# Patient Record
Sex: Female | Born: 1950 | Race: Black or African American | Hispanic: No | Marital: Single | Smoking: Never smoker
Health system: Southern US, Community
[De-identification: ages and names within clinical notes are randomized; demographics above are authoritative.]

## PROBLEM LIST (undated history)

## (undated) DIAGNOSIS — I10 Essential (primary) hypertension: Secondary | ICD-10-CM

## (undated) DIAGNOSIS — E7800 Pure hypercholesterolemia, unspecified: Secondary | ICD-10-CM

## (undated) HISTORY — PX: MYOMECTOMY: SHX85

---

## 1997-12-29 ENCOUNTER — Other Ambulatory Visit: Admission: RE | Admit: 1997-12-29 | Discharge: 1997-12-29 | Payer: Self-pay | Admitting: Family Medicine

## 2013-12-23 ENCOUNTER — Emergency Department (HOSPITAL_BASED_OUTPATIENT_CLINIC_OR_DEPARTMENT_OTHER)

## 2013-12-23 ENCOUNTER — Emergency Department (HOSPITAL_BASED_OUTPATIENT_CLINIC_OR_DEPARTMENT_OTHER)
Admission: EM | Admit: 2013-12-23 | Discharge: 2013-12-23 | Disposition: A | Discharge status: Home / Self Care | Attending: Emergency Medicine | Admitting: Emergency Medicine

## 2013-12-23 ENCOUNTER — Encounter (HOSPITAL_BASED_OUTPATIENT_CLINIC_OR_DEPARTMENT_OTHER): Payer: Self-pay | Admitting: Emergency Medicine

## 2013-12-23 DIAGNOSIS — Y990 Civilian activity done for income or pay: Secondary | ICD-10-CM | POA: Insufficient documentation

## 2013-12-23 DIAGNOSIS — IMO0002 Reserved for concepts with insufficient information to code with codable children: Secondary | ICD-10-CM | POA: Diagnosis not present

## 2013-12-23 DIAGNOSIS — Y9389 Activity, other specified: Secondary | ICD-10-CM | POA: Insufficient documentation

## 2013-12-23 DIAGNOSIS — I10 Essential (primary) hypertension: Secondary | ICD-10-CM | POA: Insufficient documentation

## 2013-12-23 DIAGNOSIS — Y9289 Other specified places as the place of occurrence of the external cause: Secondary | ICD-10-CM | POA: Diagnosis not present

## 2013-12-23 DIAGNOSIS — W2209XA Striking against other stationary object, initial encounter: Secondary | ICD-10-CM | POA: Insufficient documentation

## 2013-12-23 DIAGNOSIS — Z79899 Other long term (current) drug therapy: Secondary | ICD-10-CM | POA: Diagnosis not present

## 2013-12-23 DIAGNOSIS — S0990XA Unspecified injury of head, initial encounter: Secondary | ICD-10-CM | POA: Diagnosis not present

## 2013-12-23 DIAGNOSIS — T07XXXA Unspecified multiple injuries, initial encounter: Secondary | ICD-10-CM

## 2013-12-23 DIAGNOSIS — S0993XA Unspecified injury of face, initial encounter: Secondary | ICD-10-CM | POA: Diagnosis not present

## 2013-12-23 DIAGNOSIS — E7800 Pure hypercholesterolemia, unspecified: Secondary | ICD-10-CM | POA: Diagnosis not present

## 2013-12-23 DIAGNOSIS — Z880 Allergy status to penicillin: Secondary | ICD-10-CM | POA: Diagnosis not present

## 2013-12-23 DIAGNOSIS — S199XXA Unspecified injury of neck, initial encounter: Secondary | ICD-10-CM

## 2013-12-23 HISTORY — DX: Essential (primary) hypertension: I10

## 2013-12-23 HISTORY — DX: Pure hypercholesterolemia, unspecified: E78.00

## 2013-12-23 MED ORDER — NAPROXEN 375 MG PO TABS: 375.0000 mg | ORAL_TABLET | Freq: Two times a day (BID) | ORAL | Status: AC

## 2013-12-23 MED ORDER — IBUPROFEN 800 MG PO TABS
800.0000 mg | ORAL_TABLET | Freq: Once | ORAL | Status: AC
  Administered 2013-12-23: 800 mg via ORAL
  Filled 2013-12-23: qty 1

## 2013-12-23 NOTE — ED Notes (Signed)
Patient reports that she was at work and a post hit her to the left side of her face leaving an abraision, and to the right neck. Patient states that she may have been hit to the left knee since she has pain there as well.

## 2013-12-23 NOTE — Discharge Instructions (Signed)

## 2013-12-23 NOTE — ED Provider Notes (Signed)
CSN: [id number]     Arrival date & time 12/23/13  0204 History   First MD Initiated Contact with Patient 12/23/13 0315     Chief Complaint  Patient presents with  . Head Injury  . Knee Pain     (Consider location/radiation/quality/duration/timing/severity/associated sxs/prior Treatment) Patient is a 63 y.o. female presenting with head injury. The history is provided by the patient.  Head Injury Location:  L temporal Mechanism of injury: direct blow   Mechanism of injury comment:  Metal gate from a mail container that hit the right neck causing an abrasion and then hit patient again in left temple Pain details:    Radiates to:  Face   Severity:  Moderate   Timing:  Constant   Progression:  Unchanged Chronicity:  New Relieved by:  Nothing Worsened by:  Nothing tried Ineffective treatments:  None tried Associated symptoms: no blurred vision, no neck pain, no numbness, no seizures and no vomiting   Risk factors: occupational exposure   Tetanus UTD.  Reports left knee hurts as well.    Past Medical History  Diagnosis Date  . Hypertension   . Hypercholesteremia    Past Surgical History  Procedure Laterality Date  . Myomectomy     No family history on file. History  Substance Use Topics  . Smoking status: Never Smoker   . Smokeless tobacco: Not on file  . Alcohol Use: No   OB History   Grav Para Term Preterm Abortions TAB SAB Ect Mult Living                 Review of Systems  Constitutional: Negative for fever.  Eyes: Negative for blurred vision.  Gastrointestinal: Negative for vomiting.  Musculoskeletal: Negative for neck pain.  Neurological: Negative for seizures, facial asymmetry, weakness and numbness.  All other systems reviewed and are negative.     Allergies  Penicillins  Home Medications   Prior to Admission medications   Medication Sig Start Date End Date Taking? Authorizing Provider  pravastatin (PRAVACHOL) 10 MG tablet Take 10 mg by mouth daily.    Yes Historical Provider, MD  triamterene (DYRENIUM) 50 MG capsule Take 50 mg by mouth daily.   Yes Historical Provider, MD   BP 134/76  Pulse 73  Temp(Src) 98.1 F (36.7 C) (Oral)  Resp 18  Wt 155 lb (70.308 kg)  SpO2 100% Physical Exam  Constitutional: She is oriented to person, place, and time. She appears well-developed and well-nourished. No distress.  HENT:  Head: Normocephalic. Head is without raccoon's eyes and without Battle's sign.    Right Ear: No hemotympanum.  Left Ear: No hemotympanum.  Mouth/Throat: Oropharynx is clear and moist.  Eyes: Conjunctivae and EOM are normal. Pupils are equal, round, and reactive to light.  Neck: Normal range of motion. Neck supple.  No bony tenderness  Cardiovascular: Normal rate, regular rhythm and intact distal pulses.   Pulmonary/Chest: Effort normal and breath sounds normal. She has no wheezes. She has no rales.  Abdominal: Soft. Bowel sounds are normal. There is no tenderness. There is no rebound and no guarding.  Musculoskeletal: Normal range of motion.  Left knee has a bruise that is old in nature as is yellowing over tibial plateau.  There is no plateau tenderness. No effusion no swelling no new bruising or hematomas.  No patella alta or baja.  Negative anterior and posterior drawer tests;  no laxity to varus or valgus stress  Neurological: She is alert and oriented to person,  place, and time. She has normal reflexes.  Skin: Skin is warm and dry.  Psychiatric: She has a normal mood and affect.    ED Course  Procedures (including critical care time) Labs Review Labs Reviewed - No data to display  Imaging Review Ct Head Wo Contrast  12/23/2013   CLINICAL DATA:  Struck head on a pole at work. Head injury. Headache. Neck pain. No loss of consciousness.  EXAM: CT HEAD WITHOUT CONTRAST  CT CERVICAL SPINE WITHOUT CONTRAST  TECHNIQUE: Multidetector CT imaging of the head and cervical spine was performed following the standard protocol  without intravenous contrast. Multiplanar CT image reconstructions of the cervical spine were also generated.  COMPARISON:  None.  FINDINGS: CT HEAD FINDINGS  Ventricles and sulci appear symmetrical. No mass effect or midline shift. No abnormal extra-axial fluid collections. Gray-white matter junctions are distinct. Basal cisterns are not effaced. No evidence of acute intracranial hemorrhage. No depressed skull fractures. Air-fluid level in the left maxillary antrum. This could indicate sinusitis or may be posttraumatic.  CT CERVICAL SPINE FINDINGS  There is reversal of the usual cervical lordosis. This could be due to patient positioning but ligamentous injury or muscle spasm can also have this appearance. Degenerative changes throughout the cervical spine with narrowed cervical interspaces and endplate hypertrophic changes most prominent at C4-5, C5-6, and C6-7 levels. Prominent disc osteophyte complexes at C4-5 and C5-6 levels. Mild degenerative changes in the facet joints. Normal alignment of the facet joints. C1-2 articulation appears intact. No vertebral compression deformities. No prevertebral soft tissue swelling. No focal bone lesion or bone destruction. Bone cortex and trabecular architecture appear intact.  IMPRESSION: No acute intracranial abnormalities. Air-fluid level in the left maxillary antrum.  Nonspecific reversal of the usual cervical lordosis. No displaced cervical spine fractures identified   Electronically Signed   By: Burman NievesWilliam  Stevens M.D.   On: 12/23/2013 03:55   Ct Cervical Spine Wo Contrast  12/23/2013   CLINICAL DATA:  Struck head on a pole at work. Head injury. Headache. Neck pain. No loss of consciousness.  EXAM: CT HEAD WITHOUT CONTRAST  CT CERVICAL SPINE WITHOUT CONTRAST  TECHNIQUE: Multidetector CT imaging of the head and cervical spine was performed following the standard protocol without intravenous contrast. Multiplanar CT image reconstructions of the cervical spine were also  generated.  COMPARISON:  None.  FINDINGS: CT HEAD FINDINGS  Ventricles and sulci appear symmetrical. No mass effect or midline shift. No abnormal extra-axial fluid collections. Gray-white matter junctions are distinct. Basal cisterns are not effaced. No evidence of acute intracranial hemorrhage. No depressed skull fractures. Air-fluid level in the left maxillary antrum. This could indicate sinusitis or may be posttraumatic.  CT CERVICAL SPINE FINDINGS  There is reversal of the usual cervical lordosis. This could be due to patient positioning but ligamentous injury or muscle spasm can also have this appearance. Degenerative changes throughout the cervical spine with narrowed cervical interspaces and endplate hypertrophic changes most prominent at C4-5, C5-6, and C6-7 levels. Prominent disc osteophyte complexes at C4-5 and C5-6 levels. Mild degenerative changes in the facet joints. Normal alignment of the facet joints. C1-2 articulation appears intact. No vertebral compression deformities. No prevertebral soft tissue swelling. No focal bone lesion or bone destruction. Bone cortex and trabecular architecture appear intact.  IMPRESSION: No acute intracranial abnormalities. Air-fluid level in the left maxillary antrum.  Nonspecific reversal of the usual cervical lordosis. No displaced cervical spine fractures identified   Electronically Signed   By: Marisa CyphersWilliam  Stevens M.D.  On: 12/23/2013 03:55     EKG Interpretation None      MDM   Final diagnoses:  None   Tetanus UTD Abrasions, bacitracin applied and clean dressing.  Neosporin twice daily x 7 days.  Patient has no weakness or numbness in flexion or extension. Ligamentous injury very unlikely.  NO bony tenderness.  NSAIDs for pain.     Jasmine Awe, MD 12/23/13 [phone]

## 2015-08-25 IMAGING — CT CT CERVICAL SPINE W/O CM
3 of 4 series · 12 of 33 positions shown, 14 images · non-contrast
Comparison: None.

CLINICAL DATA: Struck head on a pole at work. Head injury.
Headache. Neck pain. No loss of consciousness.

EXAM:
CT HEAD WITHOUT CONTRAST
CT CERVICAL SPINE WITHOUT CONTRAST
TECHNIQUE: Multidetector CT imaging of the head and cervical spine was
performed following the standard protocol without intravenous
contrast. Multiplanar CT image reconstructions of the cervical spine
were also generated.

[Series 6: c_spine 2.0 coronal · coronal · 0.25mm/px · 3 of 44 slices shown]
[im 9/44  bone]
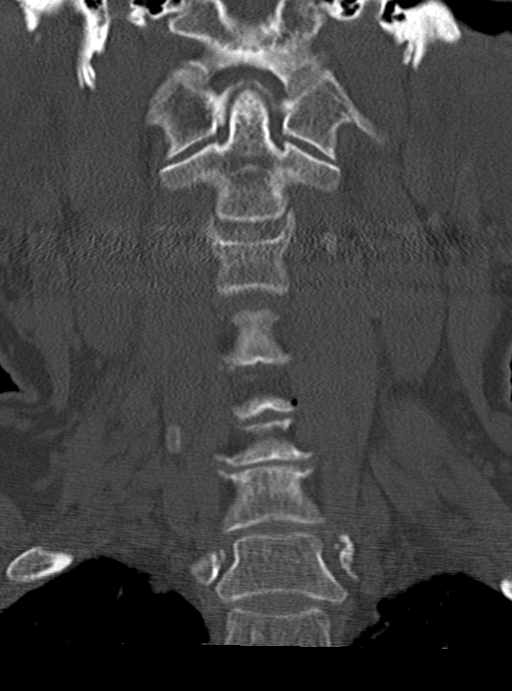
[im 18/44  bone]
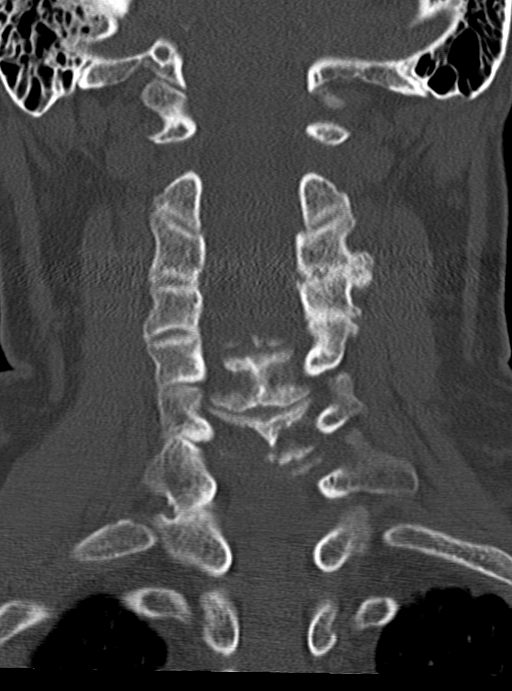
[im 26/44  bone]
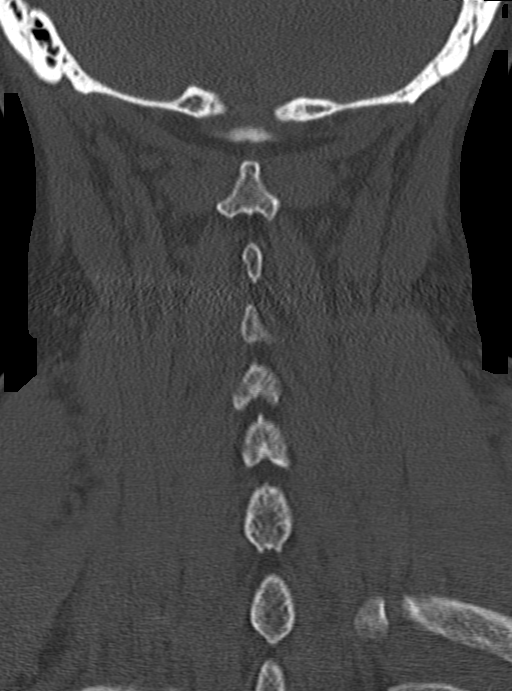

[Series 7: c_spine 2.0 sagittal · sagittal · 0.25mm/px · 5 of 52 slices shown, 6 images]
[im 18/52  bone]
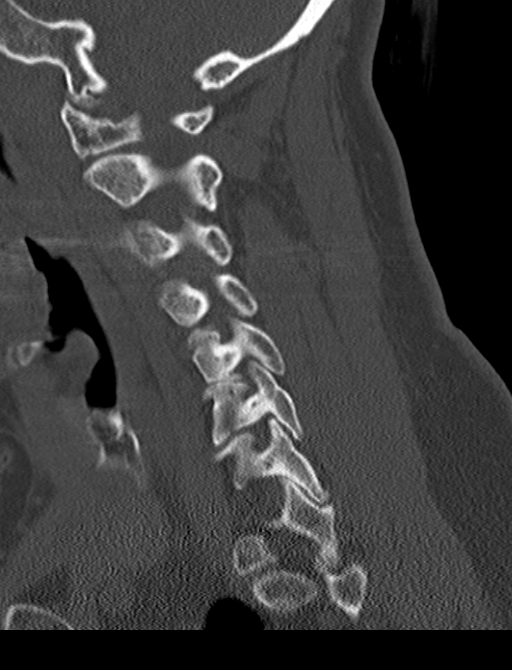
[im 22/52  bone]
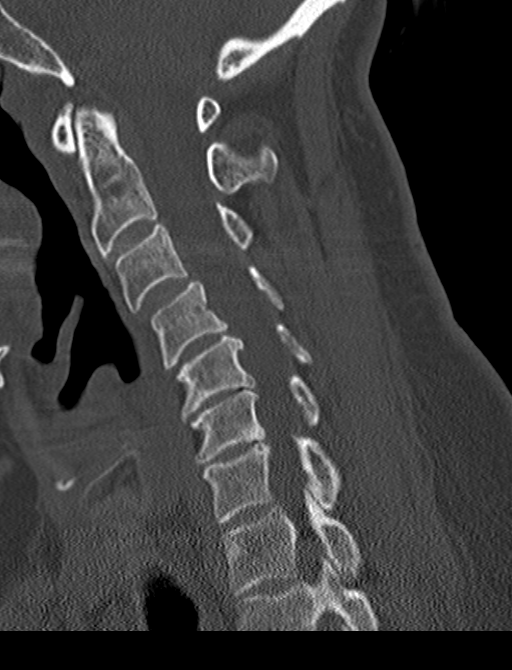
[im 26/52  soft-tissue]
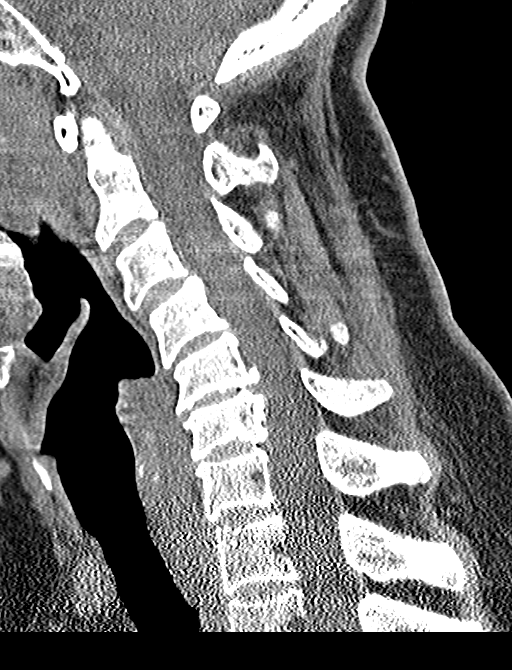
[im 26/52  bone]
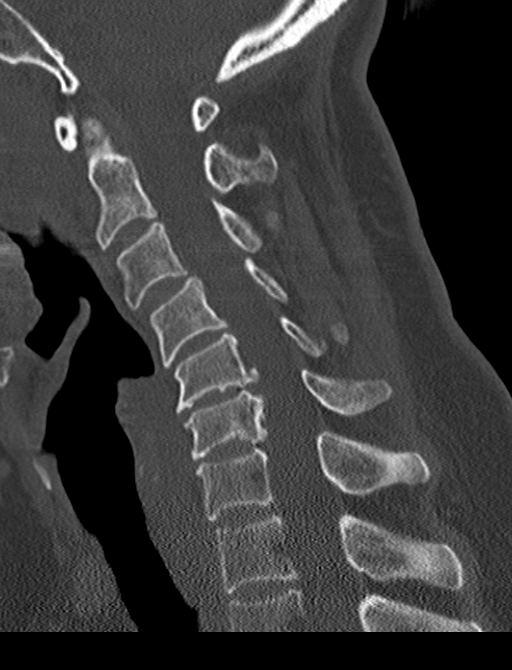
[im 30/52  bone]
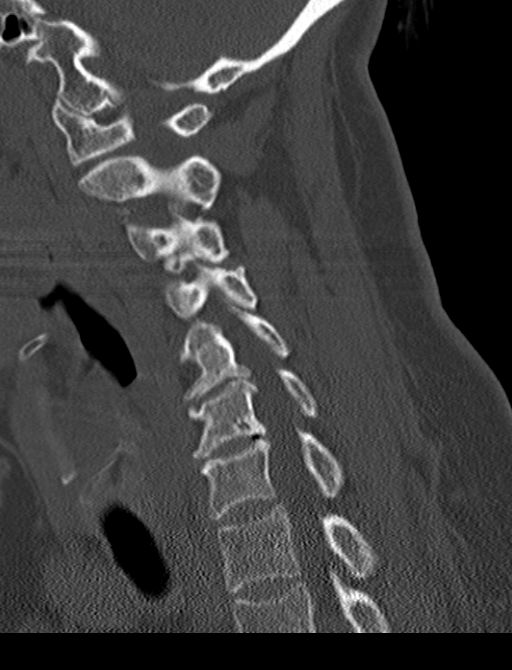
[im 35/52  bone]
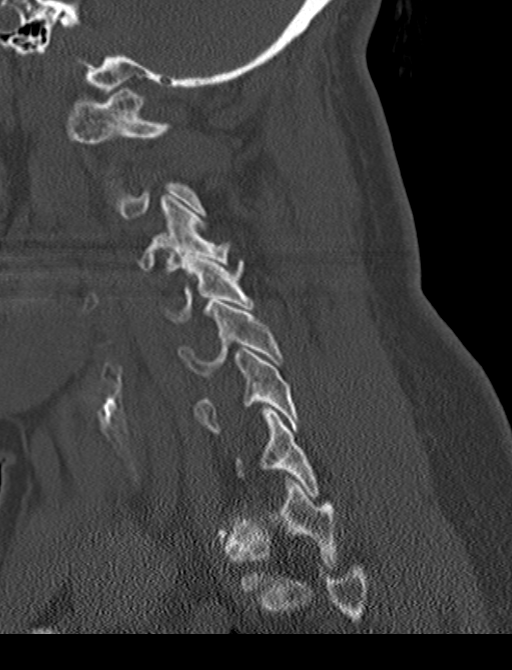

[Series 8: c_spine 2.0 orth ax · axial · 0.17mm/px · z∈[-196,-82]mm · 4 of 87 slices shown, 5 images]
[im 13/87  soft-tissue]
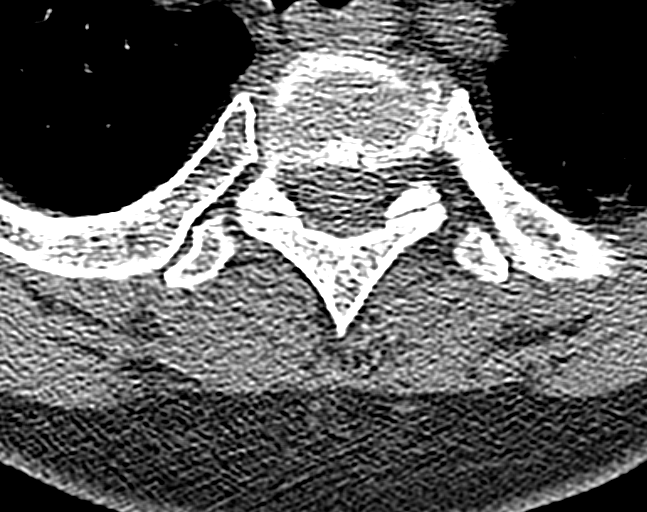
[im 13/87  bone]
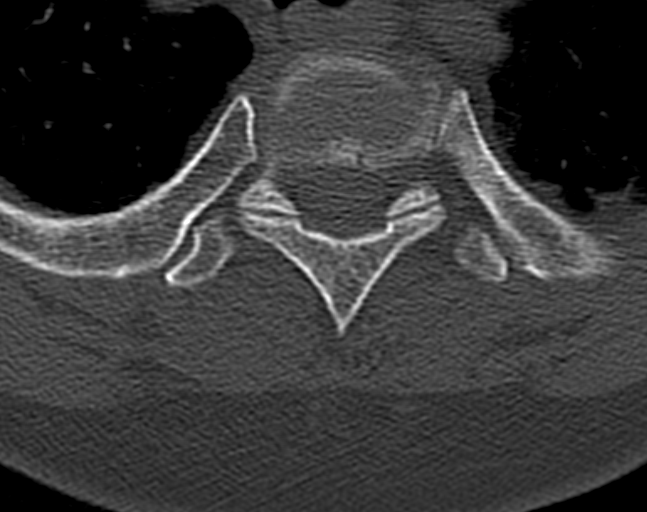
[im 37/87  bone]
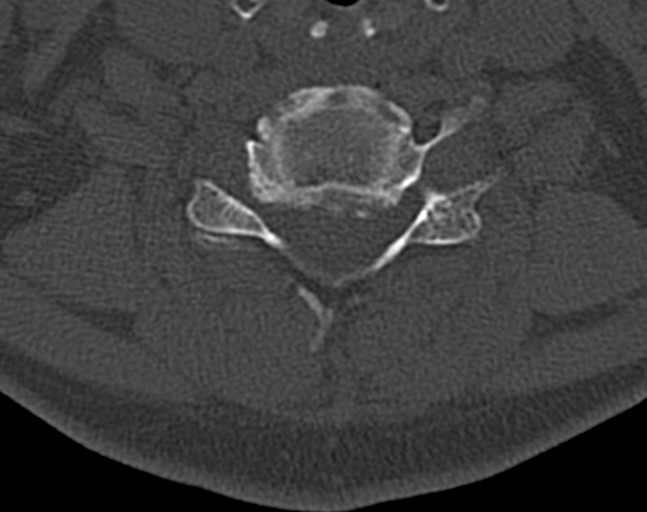
[im 50/87  bone]
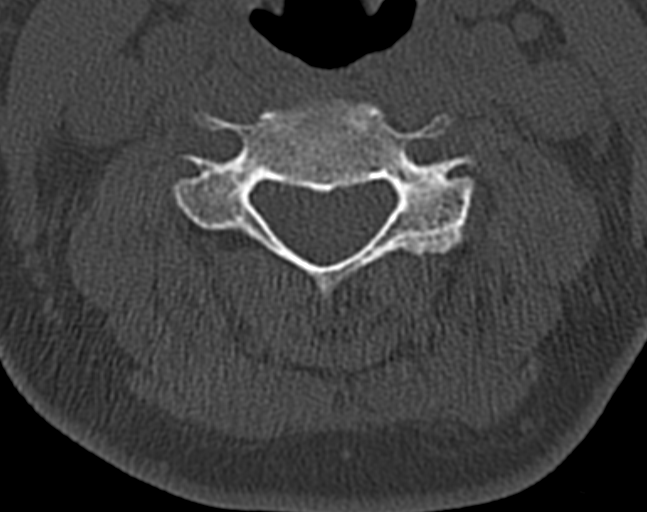
[im 74/87  bone]
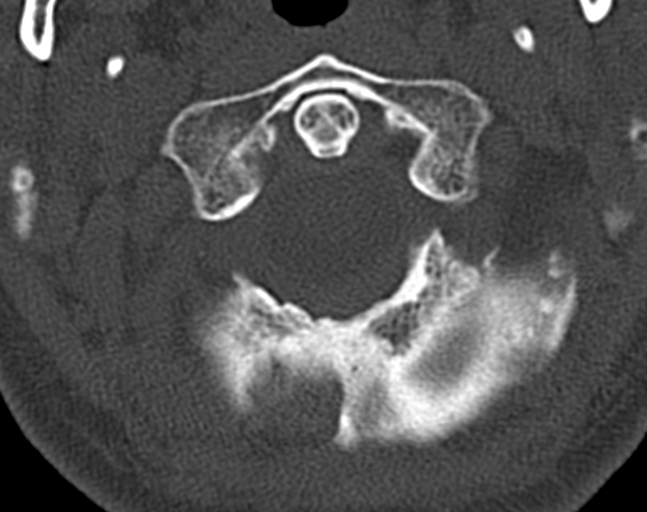

[12 of 33 positions shown; findings below may reference images not displayed]

FINDINGS: CT HEAD FINDINGS

Ventricles and sulci appear symmetrical. No mass effect or midline
shift. No abnormal extra-axial fluid collections. Gray-white matter
junctions are distinct. Basal cisterns are not effaced. No evidence
of acute intracranial hemorrhage. No depressed skull fractures.
Air-fluid level in the left maxillary antrum. This could indicate
sinusitis or may be posttraumatic.

CT CERVICAL SPINE FINDINGS

There is reversal of the usual cervical lordosis. This could be due
to patient positioning but ligamentous injury or muscle spasm can
also have this appearance. Degenerative changes throughout the
cervical spine with narrowed cervical interspaces and endplate
hypertrophic changes most prominent at C4-5, C5-6, and C6-7 levels.
Prominent disc osteophyte complexes at C4-5 and C5-6 levels. Mild
degenerative changes in the facet joints. Normal alignment of the
facet joints. C1-2 articulation appears intact. No vertebral
compression deformities. No prevertebral soft tissue swelling. No
focal bone lesion or bone destruction. Bone cortex and trabecular
architecture appear intact.
IMPRESSION: No acute intracranial abnormalities. Air-fluid level in the left
maxillary antrum.

Nonspecific reversal of the usual cervical lordosis. No displaced
cervical spine fractures identified

## 2021-11-08 ENCOUNTER — Telehealth: Payer: Self-pay | Admitting: Urology

## 2021-11-08 ENCOUNTER — Ambulatory Visit: Payer: Federal, State, Local not specified - PPO | Admitting: Podiatry

## 2021-11-08 ENCOUNTER — Ambulatory Visit (INDEPENDENT_AMBULATORY_CARE_PROVIDER_SITE_OTHER): Payer: Federal, State, Local not specified - PPO

## 2021-11-08 DIAGNOSIS — M2011 Hallux valgus (acquired), right foot: Secondary | ICD-10-CM

## 2021-11-08 DIAGNOSIS — M2012 Hallux valgus (acquired), left foot: Secondary | ICD-10-CM | POA: Diagnosis not present

## 2021-11-08 DIAGNOSIS — M2041 Other hammer toe(s) (acquired), right foot: Secondary | ICD-10-CM | POA: Diagnosis not present

## 2021-11-08 NOTE — Telephone Encounter (Signed)
DOS - 11/30/21 ? ?LAPIDUS [employer]. INC. BUNIONECTOMY RIGHT --- [phone] ?METATARSAL OSTEOTOMY 2ND RIGHT --- [id number] ?HAMMERTOE REPAIR 2,3 RIGHT --- 98921 ? ?[employer] EFFECTIVE DATE - 03/15/07 ? ?PLAN DEDUCTIBLE - $350.00 W/ $230.00 REMAINING ?OUT OF POCKET - $6,000.00 W/ $5,695.00 REMAINING ?COINSURANCE - 0% ?COPAY - $0.00  ? ? ?NO PRIOR AUTH IS REQUIRED ? ?

## 2021-11-08 NOTE — Progress Notes (Signed)
? ?  Subjective: 71 y.o. female presenting today as a new patient for evaluation of a symptomatic bunion to the bilateral feet, RT > LT, as well as hammertoe deformities.  Patient states that bunions run in her family.  Her right foot has been constantly painful and tender over the last several years increasing in pain.  She has tried different shoe gear modifications and arch supports with no improvement.  She states that she is ready to have surgery to have the bunions corrected for.  Patient is concerned now because it is pushing against the adjacent lesser digits as well. ? ?Past Medical History:  ?Diagnosis Date  ? Hypercholesteremia   ? Hypertension   ? ?Past Surgical History:  ?Procedure Laterality Date  ? MYOMECTOMY    ? ?Allergies  ?Allergen Reactions  ? Penicillins Anaphylaxis  ? ? ?Objective: ?Physical Exam ?General: The patient is alert and oriented x3 in no acute distress. ? ?Dermatology: Skin is cool, dry and supple bilateral lower extremities. Negative for open lesions or macerations. ? ?Vascular: Palpable pedal pulses bilaterally. No edema or erythema noted. Capillary refill within normal limits. ? ?Neurological: Epicritic and protective threshold grossly intact bilaterally.  ? ?Musculoskeletal Exam: Clinical evidence of bunion deformity noted to the respective foot. There is moderate pain on palpation range of motion of the first MPJ. Lateral deviation of the hallux noted consistent with hallux abductovalgus. ?Hammertoe contracture also noted on clinical exam to digits #2 and 3 of the right foot. Symptomatic pain on palpation and range of motion also noted to the metatarsal phalangeal joints of the respective hammertoe digits.   ? ?Radiographic Exam: Increased intermetatarsal angle greater than 15? with a hallux abductus angle greater than 30? noted on AP view. Moderate degenerative changes noted within the first MPJ. Contracture deformity also noted to the interphalangeal joints and MPJs of the  digits of the respective hammertoes.  On radiographic exam there does appear to be an elongated second metatarsal extending past the metatarsal parabola ? ? ? ?Assessment: ?1. HAV w/ bunion deformity right ?2. Hammertoe deformity 2, 3 right ?3.  Elongated metatarsals second right ? ? ?Plan of Care:  ?1. Patient was evaluated. X-Rays reviewed. ?2. Today we discussed the conservative versus surgical management of the presenting pathology. The patient opts for surgical management. All possible complications and details of the procedure were explained. All patient questions were answered. No guarantees were expressed or implied. ?3. Authorization for surgery was initiated today. Surgery will consist of Lapidus type bunionectomy with possible Akin osteotomy right.  Hammertoe arthroplasty 2, 3 right with possible Weil shortening osteotomy second metatarsal right ?4.  Return to clinic 1 week postop ? 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  ? ? ? ?Felecia Shelling, DPM ?[facility] ? ?Dr. Felecia Shelling, DPM  ?  ?[address]                                        ?[place], [place] [address]                ?Office [phone]  ?Fax [phone] ? ? ? ?  ?

## 2021-11-09 DIAGNOSIS — M79676 Pain in unspecified toe(s): Secondary | ICD-10-CM

## 2021-11-30 ENCOUNTER — Other Ambulatory Visit: Payer: Self-pay | Admitting: Podiatry

## 2021-11-30 DIAGNOSIS — M2041 Other hammer toe(s) (acquired), right foot: Secondary | ICD-10-CM

## 2021-11-30 DIAGNOSIS — M2011 Hallux valgus (acquired), right foot: Secondary | ICD-10-CM

## 2021-11-30 MED ORDER — OXYCODONE-ACETAMINOPHEN 5-325 MG PO TABS: 1.0000 | ORAL_TABLET | ORAL | 0 refills | Status: AC | PRN

## 2021-11-30 NOTE — Progress Notes (Signed)
PRN postop 

## 2021-12-06 ENCOUNTER — Ambulatory Visit (INDEPENDENT_AMBULATORY_CARE_PROVIDER_SITE_OTHER): Payer: Federal, State, Local not specified - PPO

## 2021-12-06 ENCOUNTER — Ambulatory Visit (INDEPENDENT_AMBULATORY_CARE_PROVIDER_SITE_OTHER): Payer: Federal, State, Local not specified - PPO | Admitting: Podiatry

## 2021-12-06 DIAGNOSIS — Z9889 Other specified postprocedural states: Secondary | ICD-10-CM

## 2021-12-13 ENCOUNTER — Ambulatory Visit (INDEPENDENT_AMBULATORY_CARE_PROVIDER_SITE_OTHER): Payer: Federal, State, Local not specified - PPO

## 2021-12-13 ENCOUNTER — Ambulatory Visit (INDEPENDENT_AMBULATORY_CARE_PROVIDER_SITE_OTHER): Payer: Federal, State, Local not specified - PPO | Admitting: Podiatry

## 2021-12-13 DIAGNOSIS — Z9889 Other specified postprocedural states: Secondary | ICD-10-CM

## 2021-12-18 NOTE — Progress Notes (Addendum)
   Subjective:  Patient presents today status post Lapidus bunionectomy with Akin osteotomy and hammertoe repair second digit RT foot. DOS: 11/30/2021.  Patient states they have no pain at this time.  Overall they are doing well.  They are beginning weightbearing in the cam boot.  No new complaints at this time  Past Medical History:  Diagnosis Date   Hypercholesteremia    Hypertension    Past Surgical History:  Procedure Laterality Date   MYOMECTOMY     Allergies  Allergen Reactions   Penicillins Anaphylaxis     Objective/Physical Exam Neurovascular status intact.  Skin incisions appear to be well coapted with sutures intact. No sign of infectious process noted. No dehiscence. No active bleeding noted.  Minimal edema noted to the surgical extremity.  Overall well-healing surgical foot  Radiographic Exam 12/13/2021 RT foot:  Orthopedic hardware and osteotomies sites appear to be stable with routine healing.  There does appear to be a subtle stress reaction fracture along the diaphysis of the second metatarsal of the surgical foot.  This appears stable with stabilization with the 0.45 inch percutaneous K wire  Assessment: 1. s/p Lapidus bunionectomy with Akin osteotomy RT.  Hammertoe repair 2nd RT. DOS: 11/30/2021   Plan of Care:  1. Patient was evaluated. X-rays reviewed 2.  Sutures removed. 3.  Continue minimal weightbearing in the cam boot as tolerated 4.  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   Felecia Shelling, DPM [facility]  Dr. Felecia Shelling, DPM    [address] [place], [place] [address]                Office [phone]  Fax [phone]

## 2021-12-25 ENCOUNTER — Encounter: Payer: Federal, State, Local not specified - PPO | Admitting: Podiatry

## 2021-12-29 ENCOUNTER — Emergency Department (HOSPITAL_BASED_OUTPATIENT_CLINIC_OR_DEPARTMENT_OTHER)
Admission: EM | Admit: 2021-12-29 | Discharge: 2021-12-29 | Disposition: A | Discharge status: Home / Self Care | Payer: Federal, State, Local not specified - PPO | Attending: Emergency Medicine | Admitting: Emergency Medicine

## 2021-12-29 ENCOUNTER — Emergency Department (HOSPITAL_BASED_OUTPATIENT_CLINIC_OR_DEPARTMENT_OTHER): Payer: Federal, State, Local not specified - PPO

## 2021-12-29 ENCOUNTER — Encounter (HOSPITAL_BASED_OUTPATIENT_CLINIC_OR_DEPARTMENT_OTHER): Payer: Self-pay | Admitting: *Deleted

## 2021-12-29 DIAGNOSIS — J029 Acute pharyngitis, unspecified: Secondary | ICD-10-CM | POA: Insufficient documentation

## 2021-12-29 MED ORDER — DEXAMETHASONE 4 MG PO TABS
10.0000 mg | ORAL_TABLET | Freq: Once | ORAL | Status: AC
  Administered 2021-12-29: 10 mg via ORAL
  Filled 2021-12-29: qty 3

## 2021-12-29 NOTE — ED Provider Notes (Addendum)
[facility] EMERGENCY DEPARTMENT Provider Note   CSN: [id number] Arrival date & time: 12/29/21  1139     History  Chief Complaint  Patient presents with   Sore Throat    Natalie Simpson is a 71 y.o. female.  Patient here with sore throat ever since eating chicken soup for 5 days ago.  Patient been able to eat and drink without any issues.  Just some time she is having some irritation in the back of her throat.  Denies any fevers or chills.  Denies any nausea or vomiting.  No significant medical history other than hypertension.  She denies any fevers or chills.  No difficulty swallowing.  Nothing has made it better or worse.  The history is provided by the patient.       Home Medications Prior to Admission medications   Medication Sig Start Date End Date Taking? Authorizing Provider  naproxen (NAPROSYN) 375 MG tablet Take 1 tablet (375 mg total) by mouth 2 (two) times daily. 12/23/13   Palumbo, April, MD  oxyCODONE-acetaminophen (PERCOCET) 5-325 MG tablet Take 1 tablet by mouth every 4 (four) hours as needed for severe pain. 11/30/21   Felecia Shelling, DPM  pravastatin (PRAVACHOL) 10 MG tablet Take 10 mg by mouth daily.    [provider]  triamterene (DYRENIUM) 50 MG capsule Take 50 mg by mouth daily.    [provider]      Allergies    Penicillins and Sulfa antibiotics    Review of Systems   Review of Systems  Physical Exam Updated Vital Signs  ED Triage Vitals [12/29/21 1154]  Enc Vitals Group     BP      Pulse      Resp      Temp      Temp src      SpO2      Weight 155 lb (70.3 kg)     Height 5\' 3"  (1.6 m)     Head Circumference      Peak Flow      Pain Score 5     Pain Loc      Pain Edu?      Excl. in GC?     Physical Exam Vitals and nursing note reviewed.  Constitutional:      General: She is not in acute distress.    Appearance: She is well-developed. She is not ill-appearing.  HENT:     Head: Normocephalic and  atraumatic.     Mouth/Throat:     Mouth: Mucous membranes are moist. No oral lesions.     Pharynx: Uvula midline. No pharyngeal swelling, oropharyngeal exudate or posterior oropharyngeal erythema.     Tonsils: No tonsillar exudate or tonsillar abscesses.     Comments: No obvious foreign body Eyes:     Conjunctiva/sclera: Conjunctivae normal.  Cardiovascular:     Rate and Rhythm: Normal rate and regular rhythm.     Heart sounds: No murmur heard. Pulmonary:     Effort: Pulmonary effort is normal. No respiratory distress.     Breath sounds: Normal breath sounds.  Abdominal:     Palpations: Abdomen is soft.     Tenderness: There is no abdominal tenderness.  Musculoskeletal:        General: No swelling.     Cervical back: Neck supple.  Skin:    General: Skin is warm and dry.     Capillary Refill: Capillary refill takes less than 2 seconds.  Neurological:  General: No focal deficit present.     Mental Status: She is alert.  Psychiatric:        Mood and Affect: Mood normal.     ED Results / Procedures / Treatments   Labs (all labs ordered are listed, but only abnormal results are displayed) Labs Reviewed - No data to display  EKG None  Radiology DG Neck Soft Tissue  Result Date: 12/29/2021 CLINICAL DATA:  Possible chicken bone stuck in throat EXAM: NECK SOFT TISSUES - 1+ VIEW COMPARISON:  None Available. FINDINGS: There is no evidence of retropharyngeal soft tissue swelling or epiglottic enlargement. The cervical airway is unremarkable. No definite radio-opaque foreign body identified. There is multilevel degenerative change of the cervical spine. IMPRESSION: No definite radiopaque foreign body identified. Electronically Signed   By: Lesia Hausen M.D.   On: 12/29/2021 12:16    Procedures Procedures    Medications Ordered in ED Medications  dexamethasone (DECADRON) tablet 10 mg (has no administration in time range)    ED Course/ Medical Decision Making/ A&P                            Medical Decision Making Amount and/or Complexity of Data Reviewed Radiology: ordered.  Risk Prescription drug management.   Natalie Simpson is here with sore throat.  History of hypertension high cholesterol.  She ate some chicken soup about 4-5 days ago.  She thought she may be swallowed a chicken bone at that time.  She has not had any issues eating or drinking but she has had some irritation in the throat area during this time.  Denies any fevers or chills.  Throat overall appears without any redness or erythema or foreign body.  She has no stridor.  Soft tissue x-ray was performed that showed no foreign body.  She is in no respiratory distress.  She is eating and drinking without any issues.  She is not having that much discomfort.  We will treat with long-acting doses Decadron.  My suspicion is that maybe there is an abrasion or cut from this chicken bone.  It is possible that there may be a small piece of chicken bone somewhere continue to cause irritation.  Shared decision at this time was made to have her try Decadron at home and given that she is minimally symptomatic have her follow-up with ENT outpatient.  She understands to return if she is having difficulty eating and drinking or having worsening pain.  Discharged in good condition.  This chart was dictated using voice recognition software.  Despite best efforts to proofread,  errors can occur which can change the documentation meaning.         Final Clinical Impression(s) / ED Diagnoses Final diagnoses:  Sore throat    Rx / DC Orders ED Discharge Orders     None         Virgina Norfolk, DO 12/29/21 1226    Magalie Almon, DO 12/29/21 1228

## 2021-12-29 NOTE — ED Triage Notes (Signed)
Eating chicken soup this past Monday, states she swallowed a bone and feels like it is "stuck" in her throat and feels uncomfortable. Has no difficulty with speech or controlling secretions, able to swallow okay

## 2021-12-29 NOTE — Discharge Instructions (Signed)
Overall no foreign body seen on exam on x-ray today.  My suspicion is that may be the scratch her throat or having abrasion somewhere.  At this time I have treated you with a long-acting steroid.  As discussed, if you have difficulty eating or drinking please return for evaluation.

## 2022-01-06 ENCOUNTER — Ambulatory Visit (INDEPENDENT_AMBULATORY_CARE_PROVIDER_SITE_OTHER): Payer: Federal, State, Local not specified - PPO

## 2022-01-06 ENCOUNTER — Ambulatory Visit (INDEPENDENT_AMBULATORY_CARE_PROVIDER_SITE_OTHER): Payer: Federal, State, Local not specified - PPO | Admitting: Podiatry

## 2022-01-06 DIAGNOSIS — Z9889 Other specified postprocedural states: Secondary | ICD-10-CM | POA: Diagnosis not present

## 2022-01-06 MED ORDER — GENTAMICIN SULFATE 0.1 % EX CREA: 1.0000 | TOPICAL_CREAM | Freq: Two times a day (BID) | CUTANEOUS | 1 refills | Status: AC

## 2022-01-06 NOTE — Progress Notes (Signed)
   Subjective:  Patient presents today status post Lapidus and Aiken bunionectomy right with hammertoe arthroplasty second digit right.  DOS: 11/30/2021.  Patient states that she continues to do well.  Minimal pain.  She presents today for suture removal and possible pin removal.  She has been mostly weightbearing in the cam boot.  She unfortunately missed her last appointment because she states that she had a flat tire.  Past Medical History:  Diagnosis Date   Hypercholesteremia    Hypertension     Past Surgical History:  Procedure Laterality Date   MYOMECTOMY     Allergies  Allergen Reactions   Penicillins Anaphylaxis   Sulfa Antibiotics Hives    Has severe itching    Objective/Physical Exam Neurovascular status intact.  Skin incisions well coapted with sutures intact as well as the percutaneous pin to the second ray. No sign of infectious process noted. No dehiscence. No active bleeding noted. Moderate edema noted to the surgical extremity.  Radiographic Exam:  Orthopedic hardware and osteotomies sites appear to be stable with routine healing.  Good realignment of the first and second rays  Assessment: 1. s/p Lapidus and Aiken bunionectomy right.  Hammertoe repair second right. DOS: 11/30/2021   Plan of Care:  1. Patient was evaluated.  2.  Sutures removed 3.  Percutaneous fixation pin removed 4.  Patient may begin washing and showering getting foot wet 5.  Prescription for gentamicin cream to apply along the incision sites daily 6.  Full weightbearing cam boot 7.  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  Felecia Shelling, DPM [facility]  Dr. Felecia Shelling, DPM    [address] [place], [place] [address]                Office [phone]  Fax [phone]

## 2022-03-06 ENCOUNTER — Telehealth: Payer: Self-pay | Admitting: Podiatry

## 2022-03-06 ENCOUNTER — Ambulatory Visit: Payer: Federal, State, Local not specified - PPO | Admitting: Podiatry

## 2022-03-06 ENCOUNTER — Ambulatory Visit (INDEPENDENT_AMBULATORY_CARE_PROVIDER_SITE_OTHER): Payer: Federal, State, Local not specified - PPO

## 2022-03-06 DIAGNOSIS — M2012 Hallux valgus (acquired), left foot: Secondary | ICD-10-CM | POA: Diagnosis not present

## 2022-03-06 DIAGNOSIS — Z9889 Other specified postprocedural states: Secondary | ICD-10-CM

## 2022-03-06 NOTE — Telephone Encounter (Signed)
Please call patient she would like to come in and pick up a copy of her FMLA paperwork from June.

## 2022-03-06 NOTE — Progress Notes (Signed)
   Chief Complaint  Patient presents with   Follow-up    Follow up on bunion, patient states that everything is healing slowly.    Subjective:  Patient presents today status post Lapidus and Aiken bunionectomy right with hammertoe arthroplasty second digit right.  DOS: 11/30/2021.  Patient states that she is doing very well.  She is currently weightbearing in supportive sneakers in tennis shoes with no discomfort or pain.  She presents for further treatment and evaluation  Past Medical History:  Diagnosis Date   Hypercholesteremia    Hypertension     Past Surgical History:  Procedure Laterality Date   MYOMECTOMY     Allergies  Allergen Reactions   Penicillins Anaphylaxis   Sulfa Antibiotics Hives    Has severe itching    Objective/Physical Exam Neurovascular status intact.  Skin incisions healed.  Good alignment of the first and second rays.  There is some limited range of motion noted and minimal edema however there is no pain on palpation throughout the foot.    Radiographic Exam 03/06/2022 RT foot:  X-rays stable.  Orthopedic hardware and osteotomies sites appear to be stable with routine healing.  Good realignment of the first and second rays  Assessment: 1. s/p Lapidus and Aiken bunionectomy right.  Hammertoe repair second right. DOS: 11/30/2021   Plan of Care:  1. Patient was evaluated.  2.  Overall the patient is doing much better.  She does continue to have some swelling but minimal pain.  She is now wearing good supportive shoes and sneakers with no pain or discomfort. 3.  Light debridement of the ingrowing portion of nail was performed to the lateral border of the right great toe using a nail nipper without incident or bleeding.  She tolerated this well and did feel some relief.  If she continues to have some ingrown symptoms and pain and tenderness to the lateral border of the right hallux nail plate she can come back to the office and we can perform a partial permanent  nail avulsion to the offending border of the toenail 4.  The patient would like to eventually have left foot surgery to correct for her bunion and hammertoe to the left foot.  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 Felecia Shelling, DPM    [address] [place], [place] [address]                Office [phone]  Fax [phone]
